# Patient Record
Sex: Female | Born: 1970 | Race: Black or African American | Hispanic: No | Marital: Married | Smoking: Never smoker
Health system: Southern US, Community
[De-identification: ages and names within clinical notes are randomized; demographics above are authoritative.]

---

## 2006-07-21 ENCOUNTER — Emergency Department (HOSPITAL_COMMUNITY): Admission: EM | Admit: 2006-07-21 | Discharge: 2006-07-22 | Payer: Self-pay | Admitting: Emergency Medicine

## 2010-05-10 ENCOUNTER — Emergency Department (HOSPITAL_COMMUNITY): Admission: EM | Admit: 2010-05-10 | Discharge: 2010-05-10 | Payer: Self-pay | Admitting: Emergency Medicine

## 2017-10-26 ENCOUNTER — Emergency Department (HOSPITAL_COMMUNITY)
Admission: EM | Admit: 2017-10-26 | Discharge: 2017-10-26 | Disposition: A | Discharge status: Home / Self Care | Payer: Self-pay | Attending: Emergency Medicine | Admitting: Emergency Medicine

## 2017-10-26 ENCOUNTER — Encounter (HOSPITAL_COMMUNITY): Payer: Self-pay

## 2017-10-26 ENCOUNTER — Emergency Department (HOSPITAL_COMMUNITY): Payer: Self-pay

## 2017-10-26 DIAGNOSIS — M546 Pain in thoracic spine: Secondary | ICD-10-CM | POA: Insufficient documentation

## 2017-10-26 LAB — CBC
HCT: 33.9 % — ABNORMAL LOW (ref 36.0–46.0)
Hemoglobin: 10.3 g/dL — ABNORMAL LOW (ref 12.0–15.0)
MCH: 25.9 pg — AB (ref 26.0–34.0)
MCHC: 30.4 g/dL (ref 30.0–36.0)
MCV: 85.2 fL (ref 78.0–100.0)
PLATELETS: 276 10*3/uL (ref 150–400)
RBC: 3.98 MIL/uL (ref 3.87–5.11)
RDW: 13.9 % (ref 11.5–15.5)
WBC: 5.7 10*3/uL (ref 4.0–10.5)

## 2017-10-26 LAB — I-STAT BETA HCG BLOOD, ED (MC, WL, AP ONLY): I-stat hCG, quantitative: <5 m[IU]/mL (ref <5)

## 2017-10-26 LAB — BASIC METABOLIC PANEL
Anion gap: 7 (ref 5–15)
BUN: 16 mg/dL (ref 6–20)
CO2: 23 mmol/L (ref 22–32)
CREATININE: 0.94 mg/dL (ref 0.44–1.00)
Calcium: 8.6 mg/dL — ABNORMAL LOW (ref 8.9–10.3)
Chloride: 108 mmol/L (ref 101–111)
GFR calc Af Amer: >60 mL/min (ref >60)
GLUCOSE: 114 mg/dL — AB (ref 65–99)
Potassium: 3.9 mmol/L (ref 3.5–5.1)
Sodium: 138 mmol/L (ref 135–145)

## 2017-10-26 LAB — I-STAT TROPONIN, ED: TROPONIN I, POC: 0 ng/mL (ref 0.00–0.08)

## 2017-10-26 MED ORDER — CYCLOBENZAPRINE HCL 10 MG PO TABS
10.0000 mg | ORAL_TABLET | Freq: Once | ORAL | Status: AC
  Administered 2017-10-26: 10 mg via ORAL
  Filled 2017-10-26: qty 1

## 2017-10-26 MED ORDER — CYCLOBENZAPRINE HCL 10 MG PO TABS: 10.0000 mg | ORAL_TABLET | Freq: Two times a day (BID) | ORAL | 0 refills | Status: AC | PRN

## 2017-10-26 MED ORDER — IBUPROFEN 800 MG PO TABS
800.0000 mg | ORAL_TABLET | Freq: Once | ORAL | Status: AC
  Administered 2017-10-26: 800 mg via ORAL
  Filled 2017-10-26: qty 1

## 2017-10-26 MED ORDER — IBUPROFEN 600 MG PO TABS: 600.0000 mg | ORAL_TABLET | Freq: Four times a day (QID) | ORAL | 0 refills | Status: AC | PRN

## 2017-10-26 NOTE — ED Triage Notes (Signed)
Pt complains of back pain between her shoulder blades for two days, she states that its hard to breathe when she lays down and hard to catch her breath Pt says she lifts weights and she could have pulled a muscle but wanted to be sure

## 2017-10-26 NOTE — ED Provider Notes (Signed)
[facility]-EMERGENCY DEPT Provider Note   CSN: [id number] Arrival date & time: 10/26/17  0154     History   Chief Complaint Chief Complaint  Patient presents with  . Back Pain  . Shortness of Breath    HPI Joyce Morris is a 47 y.o. female.  Patient presents with complaint of upper back pain located in center of back between shoulder blades. Symptoms started 3 days ago without known injury, however, she does report that she works out regularly at [employer] and recently increased her weight amount. She reports when she lies down on her back she can feel the area of soreness, and feels it more when she breaths but denies SOB. No neck pain, cough, chest pain.    The history is provided by the patient. No language interpreter was used.    History reviewed. No pertinent past medical history.  There are no active problems to display for this patient.   History reviewed. No pertinent surgical history.  OB History    No data available       Home Medications    Prior to Admission medications   Medication Sig Start Date End Date Taking? Authorizing Provider  cyclobenzaprine (FLEXERIL) 10 MG tablet Take 1 tablet (10 mg total) by mouth 2 (two) times daily as needed for muscle spasms. 10/26/17   Elpidio Anis, PA-C  ibuprofen (ADVIL,MOTRIN) 600 MG tablet Take 1 tablet (600 mg total) by mouth every 6 (six) hours as needed. 10/26/17   Elpidio Anis, PA-C    Family History History reviewed. No pertinent family history.  Social History Social History   Tobacco Use  . Smoking status: Never Smoker  . Smokeless tobacco: Never Used  Substance Use Topics  . Alcohol use: No    Frequency: Never  . Drug use: No     Allergies   Patient has no allergy information on record.   Review of Systems Review of Systems  Constitutional: Negative for chills and fever.  Respiratory: Negative.  Negative for cough and shortness of breath.     Cardiovascular: Negative.  Negative for chest pain.  Gastrointestinal: Negative.  Negative for nausea and vomiting.  Musculoskeletal: Positive for back pain. Negative for neck pain.       See HPI.  Skin: Negative.   Neurological: Negative.  Negative for weakness and headaches.     Physical Exam Updated Vital Signs BP (!) 140/102   Pulse 68   Temp 98.1 F (36.7 C) (Oral)   Resp 18   LMP 09/28/2017   SpO2 96%   Physical Exam  Constitutional: She is oriented to person, place, and time. She appears well-developed and well-nourished.  HENT:  Head: Normocephalic.  Neck: Normal range of motion. Neck supple.  Cardiovascular: Normal rate, regular rhythm and intact distal pulses.  Pulmonary/Chest: Effort normal and breath sounds normal. She has no wheezes. She has no rales.  Abdominal: Soft. Bowel sounds are normal. There is no tenderness. There is no rebound and no guarding.  Musculoskeletal: Normal range of motion.       Back:  Equal and symmetric grip strength to bilateral UE's.   Neurological: She is alert and oriented to person, place, and time. No sensory deficit.  Skin: Skin is warm and dry. No rash noted.  Psychiatric: She has a normal mood and affect.     ED Treatments / Results  Labs (all labs ordered are listed, but only abnormal results are displayed) Labs Reviewed  BASIC  METABOLIC PANEL - Abnormal; Notable for the following components:      Result Value   Glucose, Bld 114 (*)    Calcium 8.6 (*)    All other components within normal limits  CBC - Abnormal; Notable for the following components:   Hemoglobin 10.3 (*)    HCT 33.9 (*)    MCH 25.9 (*)    All other components within normal limits  I-STAT TROPONIN, ED  I-STAT BETA HCG BLOOD, ED ([facility], [facility], [facility] ONLY)   Results for orders placed or performed during the hospital encounter of 10/26/17  Basic metabolic panel  Result Value Ref Range   Sodium 138 135 - 145 mmol/L   Potassium 3.9 3.5 - 5.1 mmol/L    Chloride 108 101 - 111 mmol/L   CO2 23 22 - 32 mmol/L   Glucose, Bld 114 (H) 65 - 99 mg/dL   BUN 16 6 - 20 mg/dL   Creatinine, Ser 1.610.94 0.44 - 1.00 mg/dL   Calcium 8.6 (L) 8.9 - 10.3 mg/dL   GFR calc non Af Amer >60 >60 mL/min   GFR calc Af Amer >60 >60 mL/min   Anion gap 7 5 - 15  CBC  Result Value Ref Range   WBC 5.7 4.0 - 10.5 K/uL   RBC 3.98 3.87 - 5.11 MIL/uL   Hemoglobin 10.3 (L) 12.0 - 15.0 g/dL   HCT 09.633.9 (L) 04.536.0 - 40.946.0 %   MCV 85.2 78.0 - 100.0 fL   MCH 25.9 (L) 26.0 - 34.0 pg   MCHC 30.4 30.0 - 36.0 g/dL   RDW 81.113.9 91.411.5 - 78.215.5 %   Platelets 276 150 - 400 K/uL  I-stat troponin, ED  Result Value Ref Range   Troponin i, poc 0.00 0.00 - 0.08 ng/mL   Comment 3          I-Stat beta hCG blood, ED  Result Value Ref Range   I-stat hCG, quantitative <5.0 <5 mIU/mL   Comment 3            EKG  EKG Interpretation None       Radiology Dg Chest 2 View  Result Date: 10/26/2017 CLINICAL DATA:  Shortness of breath. EXAM: CHEST - 2 VIEW COMPARISON:  Radiograph 07/22/2006 FINDINGS: The cardiomediastinal contours are normal. The lungs are clear. Pulmonary vasculature is normal. No consolidation, pleural effusion, or pneumothorax. No acute osseous abnormalities are seen. IMPRESSION: Unremarkable radiographs of the chest. Electronically Signed   By: Rubye OaksMelanie  Ehinger M.D.   On: 10/26/2017 03:27    Procedures Procedures (including critical care time)  Medications Ordered in ED Medications  ibuprofen (ADVIL,MOTRIN) tablet 800 mg (800 mg Oral Given 10/26/17 0609)  cyclobenzaprine (FLEXERIL) tablet 10 mg (10 mg Oral Given 10/26/17 0609)     Initial Impression / Assessment and Plan / ED Course  I have reviewed the triage vital signs and the nursing notes.  Pertinent labs & imaging results that were available during my care of the patient were reviewed by me and considered in my medical decision making (see chart for details).     Patient presents with upper back pain for the past  3 days. Worse with movement. She feels the discomfort worse when lying flat and worse with deep breath. She is PERC negative - doubt PE. CXR clear, no fever - doubt pneumonia. Pain worse with movement suggests musculoskeletal pain.  Will discharge with ibuprofen, Flexeril. Encouraged follow up with PCP in 3 days for recheck of symptoms.  Final Clinical Impressions(s) / ED Diagnoses   Final diagnoses:  Acute bilateral thoracic back pain    ED Discharge Orders        Ordered    ibuprofen (ADVIL,MOTRIN) 600 MG tablet  Every 6 hours PRN     10/26/17 0601    cyclobenzaprine (FLEXERIL) 10 MG tablet  2 times daily PRN     10/26/17 0601       Elpidio Anis, PA-C 10/26/17 8119    Devoria Albe, MD 10/26/17 [phone]

## 2017-10-26 NOTE — ED Notes (Signed)
Bed: [facility] Expected date:  Expected time:  Means of arrival:  Comments: 

## 2022-01-05 ENCOUNTER — Other Ambulatory Visit (HOSPITAL_COMMUNITY): Payer: Self-pay | Admitting: *Deleted

## 2022-01-05 DIAGNOSIS — R079 Chest pain, unspecified: Secondary | ICD-10-CM

## 2022-01-26 ENCOUNTER — Telehealth (HOSPITAL_COMMUNITY): Payer: Self-pay | Admitting: *Deleted

## 2022-01-26 NOTE — Telephone Encounter (Signed)
Reaching out to patient to offer assistance regarding upcoming cardiac imaging study; pt verbalizes understanding of appt date/time, parking situation and where to check in, pre-test NPO status and verified current allergies; name and call back number provided for further questions should they arise  Denell Cothern RN Navigator Cardiac Imaging [facility] [phone] office [phone] cell  Patient aware to arrive at 9am. 

## 2022-01-27 ENCOUNTER — Ambulatory Visit (HOSPITAL_COMMUNITY)
Admission: RE | Admit: 2022-01-27 | Discharge: 2022-01-27 | Disposition: A | Discharge status: Home / Self Care | Payer: Managed Care, Other (non HMO) | Source: Ambulatory Visit | Attending: Family Medicine | Admitting: Family Medicine

## 2022-01-27 DIAGNOSIS — R079 Chest pain, unspecified: Secondary | ICD-10-CM | POA: Insufficient documentation

## 2022-01-27 MED ORDER — NITROGLYCERIN 0.4 MG SL SUBL
0.8000 mg | SUBLINGUAL_TABLET | Freq: Once | SUBLINGUAL | Status: AC
  Administered 2022-01-27: 0.8 mg via SUBLINGUAL

## 2022-01-27 MED ORDER — IOHEXOL 350 MG/ML SOLN
100.0000 mL | Freq: Once | INTRAVENOUS | Status: AC | PRN
  Administered 2022-01-27: 100 mL via INTRAVENOUS

## 2022-01-27 MED ORDER — NITROGLYCERIN 0.4 MG SL SUBL
SUBLINGUAL_TABLET | SUBLINGUAL | Status: AC
  Filled 2022-01-27: qty 2

## 2022-02-14 ENCOUNTER — Other Ambulatory Visit (HOSPITAL_BASED_OUTPATIENT_CLINIC_OR_DEPARTMENT_OTHER): Payer: Self-pay | Admitting: Family Medicine

## 2022-02-14 DIAGNOSIS — R0789 Other chest pain: Secondary | ICD-10-CM

## 2023-11-03 IMAGING — CT CT HEART MORP W/ CTA COR W/ SCORE W/ CA W/CM &/OR W/O CM
4 of 7 series · 8 of 20 positions shown, 9 images · IV contrast (APPLIED)
Comparison: None Available.
COMPARISON: None Available.

Addendum:
EXAM:
OVER-READ INTERPRETATION  CT CHEST

The following report is a limited chest CT over-read performed by
01/27/2022. This over-read does not include interpretation of cardiac
or coronary anatomy or pathology. The coronary CTA and coronary
calcium score interpretation by the cardiologist is attached.
CLINICAL DATA: 51 yo female with chest pain
Cardiac/Coronary CTA
TECHNIQUE: A non-contrast, gated CT scan was obtained with axial slices of 3 mm
through the heart for calcium scoring. Calcium scoring was performed
using the Agatston method. A 120 kV prospective, gated, contrast
cardiac scan was obtained. Gantry rotation speed was 250 msecs and
collimation was 0.6 mm. Two sublingual nitroglycerin tablets (0.8
mg) were given. The 3D data set was reconstructed in 5% intervals of
the 35-75% of the R-R cycle. Diastolic phases were analyzed on a
dedicated workstation using MPR, MIP, and VRT modes. The patient
received 95 cc of contrast.

[Series 7: ts diast sharp · axial · 0.39mm/px · z∈[+1234,+1267]mm · 2 of 251 slices shown]
[im 84/251  lung]
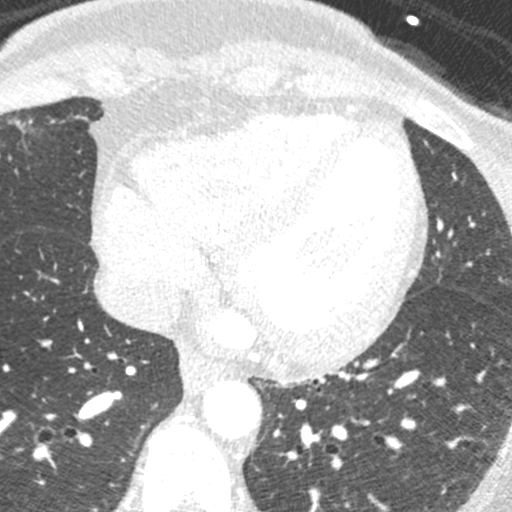
[im 167/251  lung]
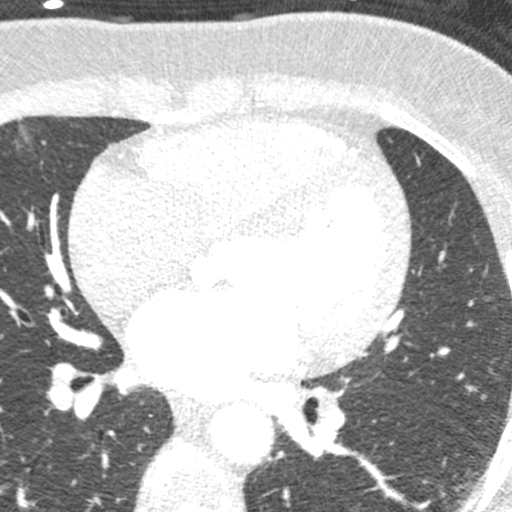

[Series 8: best diast · axial · 0.39mm/px · z∈[+1234,+1267]mm · 2 of 251 slices shown, 3 images]
[im 84/251  vessel]
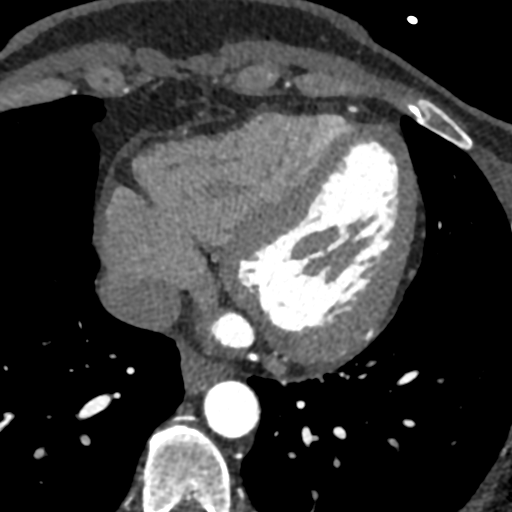
[im 84/251  lung]
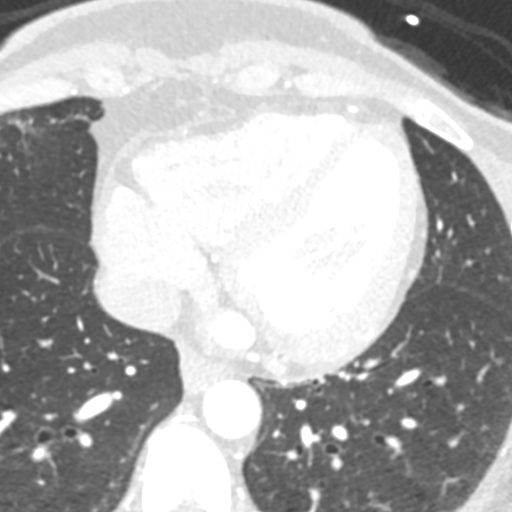
[im 167/251  vessel]
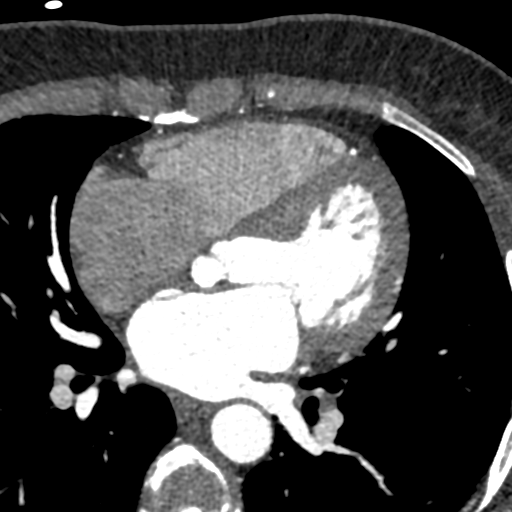

[Series 9: ts syst sharp · axial · 0.39mm/px · z∈[+1234,+1267]mm · 2 of 251 slices shown]
[im 84/251  lung]
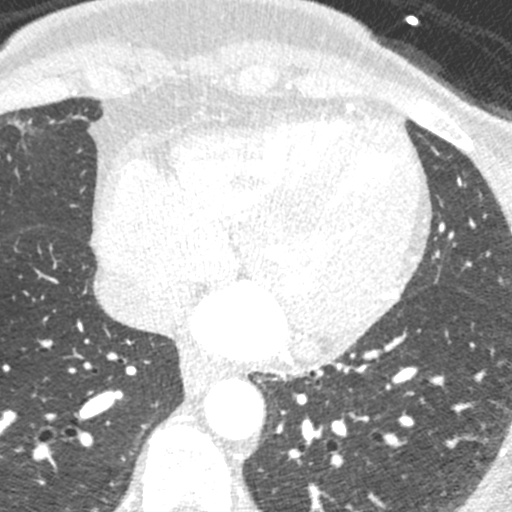
[im 167/251  lung]
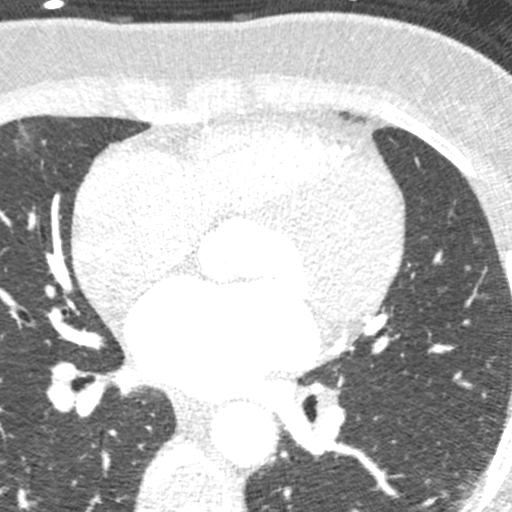

[Series 10: best syst · axial · 0.39mm/px · z∈[+1234,+1267]mm · 2 of 251 slices shown]
[im 84/251  vessel]
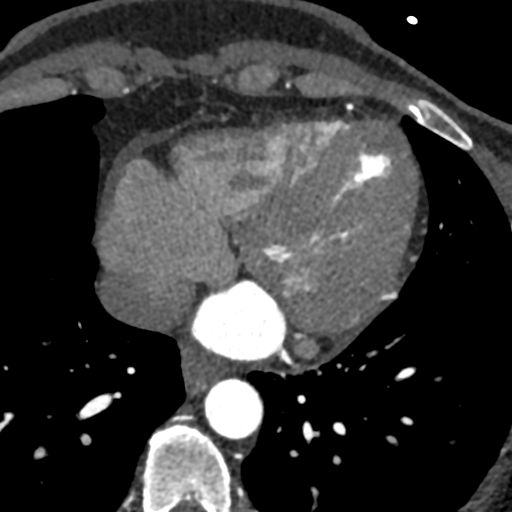
[im 167/251  vessel]
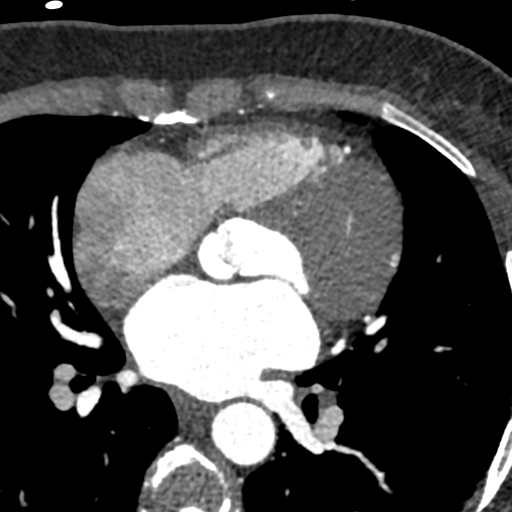

[8 of 20 positions shown; findings below may reference images not displayed]

FINDINGS: Vascular: No acute abnormality.

Mediastinum/Nodes: No signs of mediastinal mass or adenopathy.

Lungs/Pleura: No pleural effusion, airspace consolidation,
atelectasis, or pneumothorax. No suspicious lung nodule identified.

Upper Abdomen: No acute abnormality. Small cyst within dome of liver
measures 1 cm.

Musculoskeletal: No chest wall mass or suspicious bone lesions
identified.
IMPRESSION: Negative over-read.
FINDINGS: Image quality: Average.

Noise artifact is: Limited. (mis-registration).

Coronary Arteries:  Normal coronary origin.  Left dominance.

Left main: The left main is a large caliber vessel with a normal
take off from the left coronary cusp that bifurcates to form a left
anterior descending artery and a left circumflex artery. There is no
plaque or stenosis.

Left anterior descending artery: The LAD is patent without evidence
of plaque or stenosis. The LAD gives off 1 large branchin patent
diagonal branch.

Left circumflex artery: The LCX is dominant and patent with no
evidence of plaque or stenosis. The LCX gives off 2 patent obtuse
marginal branches. Terminates as posterolateral and PDA.

Right coronary artery: The RCA is non-dominant with normal take off
from the right coronary cusp. There is no evidence of plaque or
stenosis.

Right Atrium: Right atrial size is within normal limits.

Right Ventricle: The right ventricular cavity is within normal
limits.

Left Atrium: Left atrial size is normal in size with no left atrial
appendage filling defect.

Left Ventricle: The ventricular cavity size is within normal limits.
There are no stigmata of prior infarction. There is no abnormal
filling defect.

Pulmonary arteries: Normal in size without proximal filling defect.

Pulmonary veins: Normal pulmonary venous drainage.

Pericardium: Normal thickness with no significant effusion or
calcium present.

Cardiac valves: The aortic valve is trileaflet without significant
calcification. The mitral valve is normal structure without
significant calcification.

Aorta: Normal caliber with no significant disease.

Extra-cardiac findings: See attached radiology report for
non-cardiac structures.
IMPRESSION: 1. Coronary calcium score of 0. This was 0 percentile for age-, sex,
and race-matched controls.

2. Normal coronary origin with left dominance.

3. No evidence of CAD.

RECOMMENDATIONS:
CAD-RADS 0: No evidence of CAD (0%). Consider non-atherosclerotic
causes of chest pain.

*** End of Addendum ***
EXAM:
OVER-READ INTERPRETATION  CT CHEST

The following report is a limited chest CT over-read performed by
01/27/2022. This over-read does not include interpretation of cardiac
or coronary anatomy or pathology. The coronary CTA and coronary
calcium score interpretation by the cardiologist is attached.
FINDINGS: Vascular: No acute abnormality.

Mediastinum/Nodes: No signs of mediastinal mass or adenopathy.

Lungs/Pleura: No pleural effusion, airspace consolidation,
atelectasis, or pneumothorax. No suspicious lung nodule identified.

Upper Abdomen: No acute abnormality. Small cyst within dome of liver
measures 1 cm.

Musculoskeletal: No chest wall mass or suspicious bone lesions
identified.
IMPRESSION: Negative over-read.
